# Patient Record
Sex: Female | Born: 1995 | Race: Black or African American | Hispanic: No | Marital: Single | Smoking: Never smoker
Health system: Southern US, Community
[De-identification: ages and names within clinical notes are randomized; demographics above are authoritative.]

## PROBLEM LIST (undated history)

## (undated) DIAGNOSIS — F319 Bipolar disorder, unspecified: Secondary | ICD-10-CM

## (undated) DIAGNOSIS — F419 Anxiety disorder, unspecified: Secondary | ICD-10-CM

---

## 2015-11-09 ENCOUNTER — Emergency Department: Payer: BLUE CROSS/BLUE SHIELD

## 2015-11-09 ENCOUNTER — Encounter: Payer: Self-pay | Admitting: Emergency Medicine

## 2015-11-09 ENCOUNTER — Emergency Department
Admission: EM | Admit: 2015-11-09 | Discharge: 2015-11-09 | Disposition: A | Discharge status: Home / Self Care | Payer: BLUE CROSS/BLUE SHIELD | Attending: Emergency Medicine | Admitting: Emergency Medicine

## 2015-11-09 DIAGNOSIS — N939 Abnormal uterine and vaginal bleeding, unspecified: Secondary | ICD-10-CM | POA: Diagnosis present

## 2015-11-09 DIAGNOSIS — N938 Other specified abnormal uterine and vaginal bleeding: Secondary | ICD-10-CM | POA: Insufficient documentation

## 2015-11-09 LAB — URINALYSIS COMPLETE WITH MICROSCOPIC (ARMC ONLY)
BILIRUBIN URINE: NEGATIVE
Bacteria, UA: NONE SEEN
GLUCOSE, UA: NEGATIVE mg/dL
Ketones, ur: NEGATIVE mg/dL
NITRITE: NEGATIVE
PH: 6 (ref 5.0–8.0)
Protein, ur: NEGATIVE mg/dL
SPECIFIC GRAVITY, URINE: 1.017 (ref 1.005–1.030)

## 2015-11-09 LAB — CHLAMYDIA/NGC RT PCR (ARMC ONLY)
CHLAMYDIA TR: DETECTED — AB
N GONORRHOEAE: NOT DETECTED

## 2015-11-09 LAB — WET PREP, GENITAL
CLUE CELLS WET PREP: NONE SEEN
Sperm: NONE SEEN
Trich, Wet Prep: NONE SEEN
Yeast Wet Prep HPF POC: NONE SEEN

## 2015-11-09 LAB — CBC
HCT: 41.8 % (ref 35.0–47.0)
Hemoglobin: 13.8 g/dL (ref 12.0–16.0)
MCH: 26.7 pg (ref 26.0–34.0)
MCHC: 33.1 g/dL (ref 32.0–36.0)
MCV: 80.6 fL (ref 80.0–100.0)
PLATELETS: 250 10*3/uL (ref 150–440)
RBC: 5.18 MIL/uL (ref 3.80–5.20)
RDW: 14 % (ref 11.5–14.5)
WBC: 8.6 10*3/uL (ref 3.6–11.0)

## 2015-11-09 LAB — BASIC METABOLIC PANEL
Anion gap: 9 (ref 5–15)
BUN: 17 mg/dL (ref 6–20)
CALCIUM: 9.7 mg/dL (ref 8.9–10.3)
CO2: 25 mmol/L (ref 22–32)
CREATININE: 0.87 mg/dL (ref 0.44–1.00)
Chloride: 106 mmol/L (ref 101–111)
GFR calc Af Amer: >60 mL/min (ref >60)
GLUCOSE: 92 mg/dL (ref 65–99)
Potassium: 3.9 mmol/L (ref 3.5–5.1)
SODIUM: 140 mmol/L (ref 135–145)

## 2015-11-09 LAB — PREGNANCY, URINE: Preg Test, Ur: NEGATIVE

## 2015-11-09 NOTE — ED Notes (Signed)
Patient ambulatory to triage with steady gait, without difficulty or distress noted; pt reports vag bleeding and lower abdominal cramping tonight

## 2015-11-09 NOTE — ED Notes (Addendum)
Pt states she has bleeding everyday since she came off her birth control in February (depo shot), fills up 1 pad every hour she states. Pt states sharp abdominal cramps, yellow discharge and a bump on vaginal area. States frequent UTI's.

## 2015-11-09 NOTE — ED Provider Notes (Signed)
[facility] Emergency Department Provider Note   ____________________________________________  Time seen: Approximately 502 AM  I have reviewed the triage vital signs and the nursing notes.   HISTORY  Chief Complaint Vaginal Bleeding    HPI Carolyn Jacobs is a 20 y.o. female comes into the hospital today with vaginal bleeding abdominal pain and bump on her vagina and some vaginal discharge. She reports that she has been bleeding every day since she got off her birth control in February. The patient was previously receiving the Depo-Provera shot. She reports that she has not seen anyone since then. She has intermittent abdominal pain she reports that has been coming and going for months but did have an episode of pain earlier this evening. She reports that she feels up a pad an hour and a heavy she is bleeding at this time. She does have an appointment to see OB/GYN on the 12th but reports that the pain continued to come in tonight. She reports that it was lower abdominal pain and sharp but it is gone at this time. The patient is concerned that she's been bleeding for so long so she wanted to come and get checked out.   History reviewed. No pertinent past medical history.  There are no active problems to display for this patient.   History reviewed. No pertinent past surgical history.  No current outpatient prescriptions on file.  Allergies Review of patient's allergies indicates no known allergies.  No family history on file.  Social History Social History  Substance Use Topics  . Smoking status: Never Smoker   . Smokeless tobacco: None  . Alcohol Use: No    Review of Systems Constitutional: No fever/chills Eyes: No visual changes. ENT: No sore throat. Cardiovascular: Denies chest pain. Respiratory: Denies shortness of breath. Gastrointestinal:  abdominal pain.  No nausea, no vomiting.  No diarrhea.  No constipation. Genitourinary: Vaginal  bleeding Musculoskeletal: Negative for back pain. Skin: Negative for rash. Neurological: Negative for headaches, focal weakness or numbness.  10-point ROS otherwise negative.  ____________________________________________   PHYSICAL EXAM:  VITAL SIGNS: ED Triage Vitals  Enc Vitals Group     BP 11/09/15 0249 121/82 mmHg     Pulse Rate 11/09/15 0249 82     Resp 11/09/15 0249 16     Temp 11/09/15 0249 98.3 F (36.8 C)     Temp Source 11/09/15 0249 Oral     SpO2 11/09/15 0249 98 %     Weight 11/09/15 0249 145 lb (65.772 kg)     Height 11/09/15 0249 5' (1.524 m)     Head Cir --      Peak Flow --      Pain Score 11/09/15 0248 8     Pain Loc --      Pain Edu? --      Excl. in GC? --     Constitutional: Alert and oriented. Well appearing and in Mild distress. Eyes: Conjunctivae are normal. PERRL. EOMI. Head: Atraumatic. Nose: No congestion/rhinnorhea. Mouth/Throat: Mucous membranes are moist.  Oropharynx non-erythematous. Cardiovascular: Normal rate, regular rhythm. Grossly normal heart sounds.  Good peripheral circulation. Respiratory: Normal respiratory effort.  No retractions. Lungs CTAB. Gastrointestinal: Soft and nontender. No distention. Positive bowel sounds Genitourinary: Normal external genitalia with scant blood in the vault, uterus is enlarged but nontender and non-boggy. No significant discharge noted. Cervical os is closed with no cervical motion tenderness. Musculoskeletal: No lower extremity tenderness nor edema.   Neurologic:  Normal speech and  language.  Skin:  Skin is warm, dry and intact.  Psychiatric: Mood and affect are normal.   ____________________________________________   LABS (all labs ordered are listed, but only abnormal results are displayed)  Labs Reviewed  WET PREP, GENITAL - Abnormal; Notable for the following:    WBC, Wet Prep HPF POC MODERATE (*)    All other components within normal limits  URINALYSIS COMPLETEWITH MICROSCOPIC ([facility]  ONLY) - Abnormal; Notable for the following:    Color, Urine YELLOW (*)    APPearance CLEAR (*)    Hgb urine dipstick 3+ (*)    Leukocytes, UA 2+ (*)    Squamous Epithelial / LPF 0-5 (*)    All other components within normal limits  CHLAMYDIA/NGC RT PCR ([facility] ONLY)  CBC  BASIC METABOLIC PANEL  PREGNANCY, URINE   ____________________________________________  EKG  None ____________________________________________  RADIOLOGY  Ultrasound: Small amount of fluid demonstrated in the cervical canal. Uterus and ovaries otherwise appear normal ____________________________________________   PROCEDURES  Procedure(s) performed: None  Critical Care performed: No  ____________________________________________   INITIAL IMPRESSION / ASSESSMENT AND PLAN / ED COURSE  Pertinent labs & imaging results that were available during my care of the patient were reviewed by me and considered in my medical decision making (see chart for details).  This is a 20 year old female who comes into the hospital today with months of bleeding abdominal pain she reports discharge as well as a bump on her vagina. The patient appears to have an ingrown hair follicle which is causing the bump is not infected there is no purulent material draining from it. The patient reports that she has been having bleeding for months but her hemoglobin and hematocrit December unremarkable. Given the size of her uterus I will send the patient for an ultrasound to evaluate for thickened endometrium or fibroids tumors. The patient will be reassessed once she is received her ultrasound.  The patient has minimal bleeding on her exam and her blood work and vaginal swabs are negative. I informed the patient that she should still follow up with the OB/GYN for further evaluation of this dysfunctional uterine bleeding. The patient has no further questions or concerns and she'll be discharged  home. ____________________________________________   FINAL CLINICAL IMPRESSION(S) / ED DIAGNOSES  Final diagnoses:  Vaginal bleeding  Dysfunctional uterine bleeding      NEW MEDICATIONS STARTED DURING THIS VISIT:  New Prescriptions   No medications on file     Note:  This document was prepared using Dragon voice recognition software and may include unintentional dictation errors.    Rebecka Apley, MD 11/09/15 [phone]

## 2015-11-09 NOTE — ED Notes (Signed)
POCT preg Neg

## 2015-11-09 NOTE — Discharge Instructions (Signed)
Abnormal Uterine Bleeding °Abnormal uterine bleeding can affect women at various stages in life, including teenagers, women in their reproductive years, pregnant women, and women who have reached menopause. Several kinds of uterine bleeding are considered abnormal, including: °· Bleeding or spotting between periods.   °· Bleeding after sexual intercourse.   °· Bleeding that is heavier or more than normal.   °· Periods that last longer than usual. °· Bleeding after menopause.   °Many cases of abnormal uterine bleeding are minor and simple to treat, while others are more serious. Any type of abnormal bleeding should be evaluated by your health care provider. Treatment will depend on the cause of the bleeding. °HOME CARE INSTRUCTIONS °Monitor your condition for any changes. The following actions may help to alleviate any discomfort you are experiencing: °· Avoid the use of tampons and douches as directed by your health care provider. °· Change your pads frequently. °You should get regular pelvic exams and Pap tests. Keep all follow-up appointments for diagnostic tests as directed by your health care provider.  °SEEK MEDICAL CARE IF:  °· Your bleeding lasts more than 1 week.   °· You feel dizzy at times.   °SEEK IMMEDIATE MEDICAL CARE IF:  °· You pass out.   °· You are changing pads every 15 to 30 minutes.   °· You have abdominal pain. °· You have a fever.   °· You become sweaty or weak.   °· You are passing large blood clots from the vagina.   °· You start to feel nauseous and vomit. °MAKE SURE YOU:  °· Understand these instructions. °· Will watch your condition. °· Will get help right away if you are not doing well or get worse. °  °This information is not intended to replace advice given to you by your health care provider. Make sure you discuss any questions you have with your health care provider. °  °Document Released: 06/26/2005 Document Revised: 07/01/2013 Document Reviewed: 01/23/2013 °Elsevier Interactive  Patient Education ©2016 [employer] ° °Dysfunctional Uterine Bleeding °Dysfunctional uterine bleeding is abnormal bleeding from the uterus. Dysfunctional uterine bleeding includes: °· A period that comes earlier or later than usual. °· A period that is lighter, heavier, or has blood clots. °· Bleeding between periods. °· Skipping one or more periods. °· Bleeding after sexual intercourse. °· Bleeding after menopause. °HOME CARE INSTRUCTIONS  °Pay attention to any changes in your symptoms. Follow these instructions to help with your condition: °Eating °· Eat well-balanced meals. Include foods that are high in iron, such as liver, meat, shellfish, green leafy vegetables, and eggs. °· If you become constipated: °¨ Drink plenty of water. °¨ Eat fruits and vegetables that are high in water and fiber, such as spinach, carrots, raspberries, apples, and mango. °Medicines °· Take over-the-counter and prescription medicines only as told by your health care provider. °· Do not change medicines without talking with your health care provider. °· Aspirin or medicines that contain aspirin may make the bleeding worse. Do not take those medicines: °¨ During the week before your period. °¨ During your period. °· If you were prescribed iron pills, take them as told by your health care provider. Iron pills help to replace iron that your body loses because of this condition. °Activity °· If you need to change your sanitary pad or tampon more than one time every 2 hours: °¨ Lie in bed with your feet raised (elevated). °¨ Place a cold pack on your lower abdomen. °¨ Rest as much as possible until the bleeding stops or slows down. °·   Do not try to lose weight until the bleeding has stopped and your blood iron level is back to normal. °Other Instructions °· For two months, write down: °¨ When your period starts. °¨ When your period ends. °¨ When any abnormal bleeding occurs. °¨ What problems you notice. °· Keep all follow up visits as told  by your health care provider. This is important. °SEEK MEDICAL CARE IF: °· You get light-headed or weak. °· You have nausea and vomiting. °· You cannot eat or drink without vomiting. °· You feel dizzy or have diarrhea while you are taking medicines. °· You are taking birth control pills or hormones, and you want to change them or stop taking them. °SEEK IMMEDIATE MEDICAL CARE IF: °· You develop a fever or chills. °· You need to change your sanitary pad or tampon more than one time per hour. °· Your bleeding becomes heavier, or your flow contains clots more often. °· You develop pain in your abdomen. °· You lose consciousness. °· You develop a rash. °  °This information is not intended to replace advice given to you by your health care provider. Make sure you discuss any questions you have with your health care provider. °  °Document Released: 06/23/2000 Document Revised: 03/17/2015 Document Reviewed: 09/21/2014 °Elsevier Interactive Patient Education ©2016 Elsevier Inc. ° °

## 2015-11-11 ENCOUNTER — Telehealth: Payer: Self-pay | Admitting: Emergency Medicine

## 2015-11-11 NOTE — ED Notes (Signed)
Called patient and informed her of positive chlamydia test and need for treatment.  I offered to call med in. She was asking about price and i explained [facility] and free tx for stds.  She is going to call them now and be treated there.

## 2016-08-03 ENCOUNTER — Ambulatory Visit (HOSPITAL_COMMUNITY)
Admission: EM | Admit: 2016-08-03 | Discharge: 2016-08-03 | Disposition: A | Discharge status: Home / Self Care | Payer: BLUE CROSS/BLUE SHIELD | Attending: Family Medicine | Admitting: Family Medicine

## 2016-08-03 ENCOUNTER — Encounter (HOSPITAL_COMMUNITY): Payer: Self-pay | Admitting: Emergency Medicine

## 2016-08-03 DIAGNOSIS — R102 Pelvic and perineal pain: Secondary | ICD-10-CM | POA: Diagnosis not present

## 2016-08-03 DIAGNOSIS — B9789 Other viral agents as the cause of diseases classified elsewhere: Secondary | ICD-10-CM | POA: Diagnosis not present

## 2016-08-03 DIAGNOSIS — J069 Acute upper respiratory infection, unspecified: Secondary | ICD-10-CM | POA: Insufficient documentation

## 2016-08-03 DIAGNOSIS — R109 Unspecified abdominal pain: Secondary | ICD-10-CM | POA: Diagnosis present

## 2016-08-03 LAB — POCT URINALYSIS DIP (DEVICE)
Bilirubin Urine: NEGATIVE
Glucose, UA: NEGATIVE mg/dL
HGB URINE DIPSTICK: NEGATIVE
Ketones, ur: NEGATIVE mg/dL
Leukocytes, UA: NEGATIVE
Nitrite: NEGATIVE
Protein, ur: NEGATIVE mg/dL
UROBILINOGEN UA: 2 mg/dL — AB (ref 0.0–1.0)
pH: 6 (ref 5.0–8.0)

## 2016-08-03 LAB — POCT PREGNANCY, URINE: Preg Test, Ur: NEGATIVE

## 2016-08-03 MED ORDER — AZITHROMYCIN 250 MG PO TABS
1000.0000 mg | ORAL_TABLET | Freq: Once | ORAL | Status: AC
  Administered 2016-08-03: 1000 mg via ORAL

## 2016-08-03 MED ORDER — AZITHROMYCIN 250 MG PO TABS
ORAL_TABLET | ORAL | Status: AC
  Filled 2016-08-03: qty 4

## 2016-08-03 NOTE — Discharge Instructions (Signed)
We are giving you an antibiotic that should help with your abdominal pain as well as your upper respiratory symptoms.

## 2016-08-03 NOTE — ED Triage Notes (Signed)
Pt c/o cold sx onset: 3 days  Sx include: HA, left ear pain, nasal congestion, prod cough  Denies: fevers  Taking: OTC cold meds w/no relief.   Also c/o consonant LLQ pain today... LBM = 2 days ago  LMP = 06/30/16

## 2016-08-03 NOTE — ED Provider Notes (Signed)
[facility]    CSN: [id number] Arrival date & time: 08/03/16  1703     History   Chief Complaint Chief Complaint  Patient presents with  . URI  . Abdominal Pain    HPI Carolyn Jacobs is a 21 y.o. female.   This is a 21 year old woman who presents to the [facility] with abdominal pain and upper respiratory symptoms.  She's had 3 days of nasal congestion, left ear pain, and cough.  Patient's also has left or quadrant and pelvic pain for 2 days. She's had no vaginal discharge. Her last month her period was on December 22 she does not think she's pregnant. She would like a pregnancy test just to be sure.      History reviewed. No pertinent past medical history.  There are no active problems to display for this patient.   History reviewed. No pertinent surgical history.  OB History    No data available       Home Medications    Prior to Admission medications   Not on File    Family History No family history on file.  Social History Social History  Substance Use Topics  . Smoking status: Never Smoker  . Smokeless tobacco: Never Used  . Alcohol use No     Allergies   Patient has no known allergies.   Review of Systems Review of Systems  Constitutional: Negative.   HENT: Positive for congestion, ear pain and sore throat.   Eyes: Negative.   Respiratory: Positive for cough.   Cardiovascular: Negative.   Gastrointestinal: Positive for abdominal pain.  Genitourinary: Positive for pelvic pain. Negative for difficulty urinating, dyspareunia, dysuria, enuresis, frequency, genital sores, vaginal bleeding, vaginal discharge and vaginal pain.  Musculoskeletal: Negative.   Neurological: Negative.      Physical Exam Triage Vital Signs ED Triage Vitals  Enc Vitals Group     BP      Pulse      Resp      Temp      Temp src      SpO2      Weight      Height      Head Circumference      Peak Flow    Pain Score      Pain Loc      Pain Edu?      Excl. in GC?    No data found.   Updated Vital Signs BP 120/75 (BP Location: Left Arm)   Pulse 69   Temp 98.3 F (36.8 C) (Oral)   LMP 06/30/2016   SpO2 98%    Physical Exam  Constitutional: She is oriented to person, place, and time. She appears well-developed and well-nourished.  HENT:  Head: Normocephalic.  Right Ear: External ear normal.  Left Ear: External ear normal.  Mouth/Throat: Oropharynx is clear and moist.  Eyes: Conjunctivae and EOM are normal. Pupils are equal, round, and reactive to light.  Neck: Normal range of motion. Neck supple.  Cardiovascular: Normal rate, regular rhythm and normal heart sounds.   Pulmonary/Chest: Effort normal and breath sounds normal.  Abdominal: Soft. Bowel sounds are normal. She exhibits no distension. There is tenderness.  Mild tenderness with deep palpation in the left lower quadrant  Musculoskeletal: Normal range of motion.  Neurological: She is alert and oriented to person, place, and time.  Skin: Skin is warm and dry.  Nursing note and vitals reviewed.    UC  Treatments / Results  Labs (all labs ordered are listed, but only abnormal results are displayed) Labs Reviewed  POCT URINALYSIS DIP (DEVICE) - Abnormal; Notable for the following:       Result Value   Urobilinogen, UA 2.0 (*)    All other components within normal limits  POCT PREGNANCY, URINE  URINE CYTOLOGY ANCILLARY ONLY    EKG  EKG Interpretation None       Radiology No results found.  Procedures Procedures (including critical care time)  Medications Ordered in UC Medications  azithromycin (ZITHROMAX) tablet 1,000 mg (not administered)     Initial Impression / Assessment and Plan / UC Course  I have reviewed the triage vital signs and the nursing notes.  Pertinent labs & imaging results that were available during my care of the patient were reviewed by me and considered in my medical decision  making (see chart for details).     Final Clinical Impressions(s) / UC Diagnoses   Final diagnoses:  Viral upper respiratory tract infection  Pelvic pain    New Prescriptions New Prescriptions   No medications on file     Elvina Sidle, MD 08/03/16 [phone]

## 2016-08-04 LAB — URINE CYTOLOGY ANCILLARY ONLY
Chlamydia: NEGATIVE
Neisseria Gonorrhea: NEGATIVE
Trichomonas: NEGATIVE

## 2018-12-21 ENCOUNTER — Encounter (HOSPITAL_BASED_OUTPATIENT_CLINIC_OR_DEPARTMENT_OTHER): Payer: Self-pay | Admitting: Emergency Medicine

## 2018-12-21 ENCOUNTER — Other Ambulatory Visit: Payer: Self-pay

## 2018-12-21 ENCOUNTER — Emergency Department (HOSPITAL_BASED_OUTPATIENT_CLINIC_OR_DEPARTMENT_OTHER)
Admission: EM | Admit: 2018-12-21 | Discharge: 2018-12-21 | Disposition: A | Discharge status: Home / Self Care | Payer: Medicaid Other | Attending: Emergency Medicine | Admitting: Emergency Medicine

## 2018-12-21 DIAGNOSIS — M654 Radial styloid tenosynovitis [de Quervain]: Secondary | ICD-10-CM | POA: Diagnosis not present

## 2018-12-21 DIAGNOSIS — N760 Acute vaginitis: Secondary | ICD-10-CM | POA: Insufficient documentation

## 2018-12-21 DIAGNOSIS — M25532 Pain in left wrist: Secondary | ICD-10-CM | POA: Diagnosis not present

## 2018-12-21 DIAGNOSIS — N390 Urinary tract infection, site not specified: Secondary | ICD-10-CM | POA: Insufficient documentation

## 2018-12-21 DIAGNOSIS — N898 Other specified noninflammatory disorders of vagina: Secondary | ICD-10-CM | POA: Diagnosis present

## 2018-12-21 LAB — URINALYSIS, MICROSCOPIC (REFLEX): WBC, UA: >50 WBC/hpf (ref 0–5)

## 2018-12-21 LAB — URINALYSIS, ROUTINE W REFLEX MICROSCOPIC
Bilirubin Urine: NEGATIVE
Glucose, UA: NEGATIVE mg/dL
Ketones, ur: NEGATIVE mg/dL
Nitrite: NEGATIVE
Protein, ur: NEGATIVE mg/dL
Specific Gravity, Urine: 1.02 (ref 1.005–1.030)
pH: 6 (ref 5.0–8.0)

## 2018-12-21 LAB — WET PREP, GENITAL
Clue Cells Wet Prep HPF POC: NONE SEEN
Sperm: NONE SEEN
Trich, Wet Prep: NONE SEEN

## 2018-12-21 LAB — PREGNANCY, URINE: Preg Test, Ur: NEGATIVE

## 2018-12-21 MED ORDER — CEPHALEXIN 500 MG PO CAPS: 500.0000 mg | ORAL_CAPSULE | Freq: Two times a day (BID) | ORAL | 0 refills | Status: DC

## 2018-12-21 MED ORDER — FLUCONAZOLE 150 MG PO TABS: 150.0000 mg | ORAL_TABLET | Freq: Once | ORAL | 0 refills | Status: AC

## 2018-12-21 NOTE — ED Triage Notes (Signed)
L wrist pain for several days. She had an xray at [facility] but left due to wait time so she did not get the result. Denies injury but she lifts heavy boxes at work.  Vaginal discharge and itching for several days as well.

## 2018-12-21 NOTE — ED Provider Notes (Signed)
[facility] EMERGENCY DEPARTMENT Provider Note   CSN: [id number] Arrival date & time: 12/21/18  1029    History   Chief Complaint Chief Complaint  Patient presents with  . Vaginal Discharge  . Wrist Pain    HPI Fidel Levyeaira Walls is a 23 y.o. female.     Patient is a 23 year old female who presents with vaginal discharge.  She states that she has had discharge for about 2 days.  She denies any abdominal pain.  No nausea or vomiting.  She is sexually active and is on the Depakote shot.  She has had a prior history of chlamydia.  No urinary symptoms.  She also complains of pain in her left wrist.  She says is been hurting for about a week and a half.  It is exacerbated at work by lifting heavy boxes.  She was at [facility] and had an x-ray 2 days ago but left before getting the results or having evaluation.     History reviewed. No pertinent past medical history.  There are no active problems to display for this patient.   History reviewed. No pertinent surgical history.   OB History   No obstetric history on file.      Home Medications    Prior to Admission medications   Medication Sig Start Date End Date Taking? Authorizing Provider  cephALEXin (KEFLEX) 500 MG capsule Take 1 capsule (500 mg total) by mouth 2 (two) times daily. 12/21/18   Rolan BuccoBelfi, Serafino Burciaga, MD  fluconazole (DIFLUCAN) 150 MG tablet Take 1 tablet (150 mg total) by mouth once for 1 dose. If symptoms not improved, may take one more pill in 7 days 12/21/18 12/21/18  Rolan BuccoBelfi, Madiline Saffran, MD    Family History No family history on file.  Social History Social History   Tobacco Use  . Smoking status: Never Smoker  . Smokeless tobacco: Never Used  Substance Use Topics  . Alcohol use: No  . Drug use: Yes    Types: Marijuana     Allergies   Patient has no known allergies.   Review of Systems Review of Systems  Constitutional: Negative for chills, diaphoresis, fatigue and fever.  HENT:  Negative for congestion, rhinorrhea and sneezing.   Eyes: Negative.   Respiratory: Negative for cough, chest tightness and shortness of breath.   Cardiovascular: Negative for chest pain and leg swelling.  Gastrointestinal: Negative for abdominal pain, blood in stool, diarrhea, nausea and vomiting.  Genitourinary: Positive for vaginal discharge. Negative for difficulty urinating, flank pain, frequency and hematuria.  Musculoskeletal: Positive for arthralgias. Negative for back pain, joint swelling and neck pain.  Skin: Negative for rash and wound.  Neurological: Negative for dizziness, speech difficulty, weakness, numbness and headaches.     Physical Exam Updated Vital Signs BP 122/79 (BP Location: Right Arm)   Pulse 69   Temp 98.4 F (36.9 C) (Oral)   Resp 18   Ht 5\' 1"  (1.549 m)   Wt 65.8 kg   SpO2 100%   BMI 27.40 kg/m   Physical Exam Constitutional:      Appearance: She is well-developed.  HENT:     Head: Normocephalic and atraumatic.  Eyes:     Pupils: Pupils are equal, round, and reactive to light.  Neck:     Musculoskeletal: Normal range of motion and neck supple.  Cardiovascular:     Rate and Rhythm: Normal rate and regular rhythm.     Heart sounds: Normal heart sounds.  Pulmonary:  Effort: Pulmonary effort is normal. No respiratory distress.     Breath sounds: Normal breath sounds. No wheezing or rales.  Chest:     Chest wall: No tenderness.  Abdominal:     General: Bowel sounds are normal.     Palpations: Abdomen is soft.     Tenderness: There is no abdominal tenderness. There is no guarding or rebound.  Genitourinary:    Comments: Positive thick brown discharge.  No cervical motion tenderness or adnexal tenderness Musculoskeletal: Normal range of motion.     Comments: Patient has tenderness over the extensor tendon of thumb over the lateral left wrist.  There is no bony tenderness noted.  She has normal sensation and motor function distally.  Radial  pulses are intact.  Lymphadenopathy:     Cervical: No cervical adenopathy.  Skin:    General: Skin is warm and dry.     Findings: No rash.  Neurological:     Mental Status: She is alert and oriented to person, place, and time.      ED Treatments / Results  Labs (all labs ordered are listed, but only abnormal results are displayed) Labs Reviewed  WET PREP, GENITAL - Abnormal; Notable for the following components:      Result Value   Yeast Wet Prep HPF POC PRESENT (*)    WBC, Wet Prep HPF POC MANY (*)    All other components within normal limits  URINALYSIS, ROUTINE W REFLEX MICROSCOPIC - Abnormal; Notable for the following components:   APPearance CLOUDY (*)    Hgb urine dipstick MODERATE (*)    Leukocytes,Ua LARGE (*)    All other components within normal limits  URINALYSIS, MICROSCOPIC (REFLEX) - Abnormal; Notable for the following components:   Bacteria, UA MANY (*)    All other components within normal limits  PREGNANCY, URINE  RPR  HIV ANTIBODY (ROUTINE TESTING W REFLEX)  GC/CHLAMYDIA PROBE AMP ([place]) NOT AT [facility]    EKG None  Radiology No results found.  Procedures Procedures (including critical care time)  Medications Ordered in ED Medications - No data to display   Initial Impression / Assessment and Plan / ED Course  I have reviewed the triage vital signs and the nursing notes.  Pertinent labs & imaging results that were available during my care of the patient were reviewed by me and considered in my medical decision making (see chart for details).        Patient is a 23 year old female who presents with vaginal discharge.  Her wet prep is positive for yeast.  She was started on Diflucan.  She also has evidence of a UTI and was started on Keflex.  She declined prophylactic treatment for STDs at this point.  She does have pain in her left wrist which is consistent with de Quervain's tendinitis.  She is neurologically intact.  There is no bony  tenderness.  She did have imaging studies 2 days ago which were negative for bony injury.  She was placed in thumb spica splint.  She was advised in symptomatic care.  She was given a referral to follow-up with Dr. Barbaraann Barthel if her symptoms or not improved.  Final Clinical Impressions(s) / ED Diagnoses   Final diagnoses:  De Quervain's tenosynovitis, left  Acute vaginitis  Urinary tract infection without hematuria, site unspecified    ED Discharge Orders         Ordered    fluconazole (DIFLUCAN) 150 MG tablet   Once  12/21/18 1238    cephALEXin (KEFLEX) 500 MG capsule  2 times daily     12/21/18 1238           Rolan BuccoBelfi, Shilpa Bushee, MD 12/21/18 1241

## 2018-12-22 LAB — HIV ANTIBODY (ROUTINE TESTING W REFLEX): HIV Screen 4th Generation wRfx: NONREACTIVE

## 2018-12-22 LAB — RPR: RPR Ser Ql: NONREACTIVE

## 2018-12-23 LAB — GC/CHLAMYDIA PROBE AMP (~~LOC~~) NOT AT ARMC
Chlamydia: NEGATIVE
Neisseria Gonorrhea: NEGATIVE

## 2019-07-26 ENCOUNTER — Emergency Department (HOSPITAL_BASED_OUTPATIENT_CLINIC_OR_DEPARTMENT_OTHER)
Admission: EM | Admit: 2019-07-26 | Discharge: 2019-07-26 | Disposition: A | Discharge status: Home / Self Care | Payer: Medicaid Other | Attending: Emergency Medicine | Admitting: Emergency Medicine

## 2019-07-26 ENCOUNTER — Other Ambulatory Visit: Payer: Self-pay

## 2019-07-26 DIAGNOSIS — F1210 Cannabis abuse, uncomplicated: Secondary | ICD-10-CM | POA: Diagnosis not present

## 2019-07-26 DIAGNOSIS — B373 Candidiasis of vulva and vagina: Secondary | ICD-10-CM | POA: Diagnosis not present

## 2019-07-26 DIAGNOSIS — B3731 Acute candidiasis of vulva and vagina: Secondary | ICD-10-CM

## 2019-07-26 DIAGNOSIS — N898 Other specified noninflammatory disorders of vagina: Secondary | ICD-10-CM | POA: Diagnosis present

## 2019-07-26 LAB — WET PREP, GENITAL
Clue Cells Wet Prep HPF POC: NONE SEEN
Sperm: NONE SEEN
Trich, Wet Prep: NONE SEEN

## 2019-07-26 MED ORDER — FLUCONAZOLE 150 MG PO TABS: ORAL_TABLET | ORAL | 0 refills | Status: DC

## 2019-07-26 MED ORDER — FLUCONAZOLE 150 MG PO TABS
150.0000 mg | ORAL_TABLET | Freq: Once | ORAL | Status: AC
  Administered 2019-07-26: 150 mg via ORAL
  Filled 2019-07-26: qty 1

## 2019-07-26 NOTE — ED Triage Notes (Addendum)
Pt c/o vagina discomfort, thinks she has a yeast infection. Pt just finished taking medication for BV

## 2019-07-26 NOTE — ED Provider Notes (Signed)
[facility] Provider Note: Carolyn Dell, MD, FACEP  CSN: [id number] MRN: [id number] ARRIVAL: 07/26/19 at 0158 ROOM: [facility]07/[facility]07   CHIEF COMPLAINT  Vaginal Discharge   HISTORY OF PRESENT ILLNESS  07/26/19 2:51 AM Carolyn Jacobs is a 24 y.o. female with a 2-day history of vulvovaginal irritation.  She also has a white discharge with this.  Symptoms are moderate's.  Symptoms are similar to previous yeast infections.  She was recently treated for bacterial vaginosis.  She denies abdominal pain or dysuria.   No past medical history on file.  No past surgical history on file.  No family history on file.  Social History   Tobacco Use  . Smoking status: Never Smoker  . Smokeless tobacco: Never Used  Substance Use Topics  . Alcohol use: No  . Drug use: Yes    Types: Marijuana    Prior to Admission medications   Medication Sig Start Date End Date Taking? Authorizing Provider  fluconazole (DIFLUCAN) 150 MG tablet Take 1 tablet if symptoms of yeast infection last beyond 3 days. 07/26/19   Kadesha Virrueta, Jonny Ruiz, MD    Allergies Patient has no known allergies.   REVIEW OF SYSTEMS  Negative except as noted here or in the History of Present Illness.   PHYSICAL EXAMINATION  Initial Vital Signs Blood pressure 92/78, pulse 93, resp. rate 18, height 5\' 1"  (1.549 m), weight 61.2 kg, SpO2 99 %.  Examination General: Well-developed, well-nourished female in no acute distress; appearance consistent with age of record HENT: normocephalic; atraumatic Eyes: pupils equal, round and reactive to light; extraocular muscles intact Neck: supple Heart: regular rate and rhythm Lungs: clear to auscultation bilaterally Abdomen: soft; nondistended; nontender; bowel sounds present GU: Vulvovaginal inflammation with yellowish-white curd-like discharge; patient unable to tolerate speculum exam Extremities: No deformity; full range of motion; pulses normal Neurologic: Awake, alert and oriented;  motor function intact in all extremities and symmetric; no facial droop Skin: Warm and dry Psychiatric: Normal mood and affect   RESULTS  Summary of this visit's results, reviewed and interpreted by myself:   EKG Interpretation  Date/Time:    Ventricular Rate:    PR Interval:    QRS Duration:   QT Interval:    QTC Calculation:   R Axis:     Text Interpretation:        Laboratory Studies: Results for orders placed or performed during the hospital encounter of 07/26/19 (from the past 24 hour(s))  Wet prep, genital     Status: Abnormal   Collection Time: 07/26/19  3:02 AM  Result Value Ref Range   Yeast Wet Prep HPF POC PRESENT (A) NONE SEEN   Trich, Wet Prep NONE SEEN NONE SEEN   Clue Cells Wet Prep HPF POC NONE SEEN NONE SEEN   WBC, Wet Prep HPF POC MANY (A) NONE SEEN   Sperm NONE SEEN    Imaging Studies: No results found.  ED COURSE and MDM  Nursing notes, initial and subsequent vitals signs, including pulse oximetry, reviewed and interpreted by myself.  Vitals:   07/26/19 0211 07/26/19 0213  BP:  92/78  Pulse:  93  Resp:  18  TempSrc:  Oral  SpO2:  99%  Weight: 61.2 kg   Height: 5\' 1"  (1.549 m)    Medications  fluconazole (DIFLUCAN) tablet 150 mg (150 mg Oral Given 07/26/19 0307)      PROCEDURES  Procedures   ED DIAGNOSES     ICD-10-CM   1. Vaginal yeast infection  B37.3        Cartrell Bentsen, Jenny Reichmann, MD 07/26/19 [phone]

## 2020-02-15 ENCOUNTER — Other Ambulatory Visit: Payer: Self-pay

## 2020-02-15 ENCOUNTER — Emergency Department (HOSPITAL_BASED_OUTPATIENT_CLINIC_OR_DEPARTMENT_OTHER)
Admission: EM | Admit: 2020-02-15 | Discharge: 2020-02-15 | Disposition: A | Discharge status: Home / Self Care | Payer: Medicaid Other | Attending: Emergency Medicine | Admitting: Emergency Medicine

## 2020-02-15 ENCOUNTER — Encounter (HOSPITAL_BASED_OUTPATIENT_CLINIC_OR_DEPARTMENT_OTHER): Payer: Self-pay | Admitting: Emergency Medicine

## 2020-02-15 DIAGNOSIS — M7918 Myalgia, other site: Secondary | ICD-10-CM | POA: Insufficient documentation

## 2020-02-15 DIAGNOSIS — M549 Dorsalgia, unspecified: Secondary | ICD-10-CM | POA: Insufficient documentation

## 2020-02-15 DIAGNOSIS — M542 Cervicalgia: Secondary | ICD-10-CM | POA: Insufficient documentation

## 2020-02-15 DIAGNOSIS — Z79899 Other long term (current) drug therapy: Secondary | ICD-10-CM | POA: Diagnosis not present

## 2020-02-15 MED ORDER — IBUPROFEN 400 MG PO TABS
600.0000 mg | ORAL_TABLET | Freq: Once | ORAL | Status: AC
  Administered 2020-02-15: 600 mg via ORAL
  Filled 2020-02-15: qty 1

## 2020-02-15 MED ORDER — CYCLOBENZAPRINE HCL 10 MG PO TABS: 10.0000 mg | ORAL_TABLET | Freq: Two times a day (BID) | ORAL | 0 refills | Status: DC | PRN

## 2020-02-15 MED ORDER — ACETAMINOPHEN 325 MG PO TABS
650.0000 mg | ORAL_TABLET | Freq: Once | ORAL | Status: AC
  Administered 2020-02-15: 650 mg via ORAL
  Filled 2020-02-15: qty 2

## 2020-02-15 NOTE — ED Notes (Signed)
Pt discharged to home. Discharge instructions have been discussed with patient and/or family members. Pt verbally acknowledges understanding d/c instructions, and endorses comprehension to checkout at registration before leaving. Pt requests to have pharmacy changed to [facility], [facility]
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
## 2020-02-15 NOTE — Discharge Instructions (Signed)
Take Flexeril as needed as prescribed for muscle tightness.  Do not drive if taking this medication. Take Motrin and Tylenol as needed as directed. Recommend warm compresses for 20 minutes at a time for sore muscles or soak in a warm tub. Check with your primary care provider, return to ER for severe or concerning symptoms.

## 2020-05-22 ENCOUNTER — Encounter (HOSPITAL_BASED_OUTPATIENT_CLINIC_OR_DEPARTMENT_OTHER): Payer: Self-pay

## 2020-05-22 ENCOUNTER — Emergency Department (HOSPITAL_BASED_OUTPATIENT_CLINIC_OR_DEPARTMENT_OTHER)
Admission: EM | Admit: 2020-05-22 | Discharge: 2020-05-22 | Disposition: A | Discharge status: Home / Self Care | Payer: Medicaid Other | Attending: Emergency Medicine | Admitting: Emergency Medicine

## 2020-05-22 ENCOUNTER — Other Ambulatory Visit: Payer: Self-pay

## 2020-05-22 DIAGNOSIS — N3000 Acute cystitis without hematuria: Secondary | ICD-10-CM | POA: Diagnosis not present

## 2020-05-22 DIAGNOSIS — J069 Acute upper respiratory infection, unspecified: Secondary | ICD-10-CM | POA: Insufficient documentation

## 2020-05-22 DIAGNOSIS — Z20822 Contact with and (suspected) exposure to covid-19: Secondary | ICD-10-CM | POA: Diagnosis not present

## 2020-05-22 DIAGNOSIS — R0602 Shortness of breath: Secondary | ICD-10-CM | POA: Diagnosis present

## 2020-05-22 LAB — URINALYSIS, ROUTINE W REFLEX MICROSCOPIC
Bilirubin Urine: NEGATIVE
Glucose, UA: NEGATIVE mg/dL
Ketones, ur: NEGATIVE mg/dL
Nitrite: NEGATIVE
Protein, ur: NEGATIVE mg/dL
Specific Gravity, Urine: 1.01 (ref 1.005–1.030)
pH: 6 (ref 5.0–8.0)

## 2020-05-22 LAB — RESPIRATORY PANEL BY RT PCR (FLU A&B, COVID)
Influenza A by PCR: NEGATIVE
Influenza B by PCR: NEGATIVE
SARS Coronavirus 2 by RT PCR: NEGATIVE

## 2020-05-22 LAB — URINALYSIS, MICROSCOPIC (REFLEX)

## 2020-05-22 LAB — PREGNANCY, URINE: Preg Test, Ur: NEGATIVE

## 2020-05-22 MED ORDER — SULFAMETHOXAZOLE-TRIMETHOPRIM 800-160 MG PO TABS: 1.0000 | ORAL_TABLET | Freq: Two times a day (BID) | ORAL | 0 refills | Status: AC

## 2020-05-22 MED ORDER — KETOROLAC TROMETHAMINE 30 MG/ML IJ SOLN
30.0000 mg | Freq: Once | INTRAMUSCULAR | Status: AC
  Administered 2020-05-22: 30 mg via INTRAMUSCULAR
  Filled 2020-05-22: qty 1

## 2020-05-22 NOTE — ED Triage Notes (Signed)
Pt states she would also like a pregnancy test today.

## 2020-05-22 NOTE — ED Triage Notes (Signed)
Pt arrives with c/o sore throat, headache, and SOB since Monday. Pt denies being around anyone with Covid, pt is not vaccinated.

## 2020-05-22 NOTE — ED Provider Notes (Signed)
[facility] EMERGENCY DEPARTMENT Provider Note   CSN: [id number] Arrival date & time: 05/22/20  1123     History Chief Complaint  Patient presents with  . Shortness of Breath    Carolyn Jacobs is a 24 y.o. female.  Pt presents to the ED today with a headache, sore throat, cough, and sob.  Sx started on 11/8.  The pt has not been vaccinated against Covid.  No known covid exposures.        History reviewed. No pertinent past medical history.  There are no problems to display for this patient.   History reviewed. No pertinent surgical history.   OB History   No obstetric history on file.     No family history on file.  Social History   Tobacco Use  . Smoking status: Never Smoker  . Smokeless tobacco: Never Used  Vaping Use  . Vaping Use: Never used  Substance Use Topics  . Alcohol use: No  . Drug use: Not Currently    Types: Marijuana    Home Medications Prior to Admission medications   Medication Sig Start Date End Date Taking? Authorizing Provider  cyclobenzaprine (FLEXERIL) 10 MG tablet Take 1 tablet (10 mg total) by mouth 2 (two) times daily as needed for muscle spasms. 02/15/20   Milagros Loll, MD  fluconazole (DIFLUCAN) 150 MG tablet Take 1 tablet if symptoms of yeast infection last beyond 3 days. 07/26/19   Molpus, John, MD  sulfamethoxazole-trimethoprim (BACTRIM DS) 800-160 MG tablet Take 1 tablet by mouth 2 (two) times daily for 7 days. 05/22/20 05/29/20  Jacalyn Lefevre, MD    Allergies    Patient has no known allergies.  Review of Systems   Review of Systems  HENT: Positive for sore throat.   Respiratory: Positive for cough and shortness of breath.   Neurological: Positive for headaches.  All other systems reviewed and are negative.   Physical Exam Updated Vital Signs BP (!) 119/91 (BP Location: Right Arm)   Pulse 76   Temp 98.7 F (37.1 C) (Oral)   Resp 18   Ht 5\' 1"  (1.549 m)   Wt 63.5 kg   SpO2 100%   BMI 26.45  kg/m   Physical Exam Vitals and nursing note reviewed.  Constitutional:      Appearance: She is well-developed.  HENT:     Head: Normocephalic and atraumatic.     Mouth/Throat:     Mouth: Mucous membranes are moist.  Eyes:     Pupils: Pupils are equal, round, and reactive to light.  Cardiovascular:     Rate and Rhythm: Normal rate and regular rhythm.  Pulmonary:     Effort: Pulmonary effort is normal.     Breath sounds: Normal breath sounds.  Abdominal:     General: Bowel sounds are normal.     Palpations: Abdomen is soft.  Musculoskeletal:        General: Normal range of motion.     Cervical back: Normal range of motion and neck supple.  Skin:    General: Skin is warm.     Capillary Refill: Capillary refill takes less than 2 seconds.  Neurological:     General: No focal deficit present.     Mental Status: She is alert and oriented to person, place, and time.  Psychiatric:        Mood and Affect: Mood normal.        Behavior: Behavior normal.     ED Results / Procedures /  Treatments   Labs (all labs ordered are listed, but only abnormal results are displayed) Labs Reviewed  URINALYSIS, ROUTINE W REFLEX MICROSCOPIC - Abnormal; Notable for the following components:      Result Value   APPearance HAZY (*)    Hgb urine dipstick TRACE (*)    Leukocytes,Ua SMALL (*)    All other components within normal limits  URINALYSIS, MICROSCOPIC (REFLEX) - Abnormal; Notable for the following components:   Bacteria, UA MANY (*)    All other components within normal limits  RESPIRATORY PANEL BY RT PCR (FLU A&B, COVID)  PREGNANCY, URINE    EKG None  Radiology No results found.  Procedures Procedures (including critical care time)  Medications Ordered in ED Medications  ketorolac (TORADOL) 30 MG/ML injection 30 mg (30 mg Intramuscular Given 05/22/20 1213)    ED Course  I have reviewed the triage vital signs and the nursing notes.  Pertinent labs & imaging results that  were available during my care of the patient were reviewed by me and considered in my medical decision making (see chart for details).    MDM Rules/Calculators/A&P                           Covid swab negative.  I spoke with her about getting the vaccine.  She will think about it.  Pt has some bacteria in her urine, so she will be treated with bactrim.  Return if worse.  Carolyn Jacobs was evaluated in Emergency Department on 05/22/2020 for the symptoms described in the history of present illness. She was evaluated in the context of the global COVID-19 pandemic, which necessitated consideration that the patient might be at risk for infection with the SARS-CoV-2 virus that causes COVID-19. Institutional protocols and algorithms that pertain to the evaluation of patients at risk for COVID-19 are in a state of rapid change based on information released by regulatory bodies including the CDC and federal and state organizations. These policies and algorithms were followed during the patient's care in the ED. Final Clinical Impression(s) / ED Diagnoses Final diagnoses:  Viral upper respiratory tract infection  Acute cystitis without hematuria    Rx / DC Orders ED Discharge Orders         Ordered    sulfamethoxazole-trimethoprim (BACTRIM DS) 800-160 MG tablet  2 times daily        05/22/20 1255           Jacalyn Lefevre, MD 05/22/20 1258

## 2020-05-26 ENCOUNTER — Telehealth (HOSPITAL_COMMUNITY): Payer: Self-pay

## 2020-07-20 ENCOUNTER — Encounter (HOSPITAL_BASED_OUTPATIENT_CLINIC_OR_DEPARTMENT_OTHER): Payer: Self-pay | Admitting: *Deleted

## 2020-07-20 ENCOUNTER — Other Ambulatory Visit: Payer: Self-pay

## 2020-07-20 ENCOUNTER — Emergency Department (HOSPITAL_BASED_OUTPATIENT_CLINIC_OR_DEPARTMENT_OTHER)
Admission: EM | Admit: 2020-07-20 | Discharge: 2020-07-20 | Disposition: A | Discharge status: Home / Self Care | Payer: Medicaid Other | Attending: Emergency Medicine | Admitting: Emergency Medicine

## 2020-07-20 DIAGNOSIS — Z5321 Procedure and treatment not carried out due to patient leaving prior to being seen by health care provider: Secondary | ICD-10-CM | POA: Insufficient documentation

## 2020-07-20 DIAGNOSIS — U071 COVID-19: Secondary | ICD-10-CM | POA: Insufficient documentation

## 2020-07-20 NOTE — ED Triage Notes (Signed)
Covid positive test at home today. She is here with c.o headache, fatigue, loss of taste, loss of smell, runny nose, headache and body aches.

## 2021-09-13 ENCOUNTER — Emergency Department (HOSPITAL_BASED_OUTPATIENT_CLINIC_OR_DEPARTMENT_OTHER)
Admission: EM | Admit: 2021-09-13 | Discharge: 2021-09-13 | Disposition: A | Discharge status: Home / Self Care | Payer: Medicaid Other | Attending: Emergency Medicine | Admitting: Emergency Medicine

## 2021-09-13 ENCOUNTER — Other Ambulatory Visit: Payer: Self-pay

## 2021-09-13 ENCOUNTER — Encounter (HOSPITAL_BASED_OUTPATIENT_CLINIC_OR_DEPARTMENT_OTHER): Payer: Self-pay | Admitting: Emergency Medicine

## 2021-09-13 DIAGNOSIS — R110 Nausea: Secondary | ICD-10-CM

## 2021-09-13 DIAGNOSIS — R112 Nausea with vomiting, unspecified: Secondary | ICD-10-CM | POA: Insufficient documentation

## 2021-09-13 DIAGNOSIS — Z20822 Contact with and (suspected) exposure to covid-19: Secondary | ICD-10-CM | POA: Diagnosis not present

## 2021-09-13 DIAGNOSIS — R102 Pelvic and perineal pain: Secondary | ICD-10-CM | POA: Diagnosis not present

## 2021-09-13 LAB — PREGNANCY, URINE: Preg Test, Ur: NEGATIVE

## 2021-09-13 LAB — URINALYSIS, MICROSCOPIC (REFLEX)

## 2021-09-13 LAB — WET PREP, GENITAL
Clue Cells Wet Prep HPF POC: NONE SEEN
Sperm: NONE SEEN
Trich, Wet Prep: NONE SEEN
WBC, Wet Prep HPF POC: <10 (ref <10)
Yeast Wet Prep HPF POC: NONE SEEN

## 2021-09-13 LAB — URINALYSIS, ROUTINE W REFLEX MICROSCOPIC
Bilirubin Urine: NEGATIVE
Glucose, UA: NEGATIVE mg/dL
Ketones, ur: NEGATIVE mg/dL
Leukocytes,Ua: NEGATIVE
Nitrite: NEGATIVE
Protein, ur: NEGATIVE mg/dL
Specific Gravity, Urine: 1.02 (ref 1.005–1.030)
pH: 8.5 — ABNORMAL HIGH (ref 5.0–8.0)

## 2021-09-13 LAB — RESP PANEL BY RT-PCR (FLU A&B, COVID) ARPGX2
Influenza A by PCR: NEGATIVE
Influenza B by PCR: NEGATIVE
SARS Coronavirus 2 by RT PCR: NEGATIVE

## 2021-09-13 MED ORDER — IBUPROFEN 600 MG PO TABS: 600.0000 mg | ORAL_TABLET | Freq: Four times a day (QID) | ORAL | 0 refills | Status: DC | PRN

## 2021-09-13 MED ORDER — ONDANSETRON HCL 4 MG PO TABS: 4.0000 mg | ORAL_TABLET | Freq: Four times a day (QID) | ORAL | 0 refills | Status: AC

## 2021-09-13 MED ORDER — ONDANSETRON 4 MG PO TBDP
4.0000 mg | ORAL_TABLET | Freq: Once | ORAL | Status: AC
  Administered 2021-09-13: 4 mg via ORAL
  Filled 2021-09-13: qty 1

## 2021-09-13 NOTE — ED Triage Notes (Signed)
Pt arrives pov with HA, abdominal pain with n/v/d upoin waking. Reports no symptoms yesterday. Endorses concern for pregnancy and/or STD ?

## 2021-09-13 NOTE — Discharge Instructions (Addendum)
You do not have a urinary tract infection today.  Your yeast, trichomonas and bacterial vaginosis tests are also negative. ? ?Please follow-up on your COVID/flu, chlamydia and gonorrhea results online.  If you test positive for something and require treatment, the health department is a good place to start.  You may also follow-up with the provider who diagnosed you with chlamydia previously. ? ?Zofran has been sent to your pharmacy for your nausea.  You may take Tylenol or ibuprofen if you continue to have any headaches. ?

## 2021-09-13 NOTE — ED Provider Notes (Incomplete)
°  [facility] EMERGENCY DEPARTMENT Provider Note   CSN: [id number] Arrival date & time: 09/13/21  1315     History {Add pertinent medical, surgical, social history, OB history to HPI:1} Chief Complaint  Patient presents with   Abdominal Pain    Carolyn Jacobs is a 26 y.o. female. Lmp Chlamydia recent   Abdominal Pain     Home Medications Prior to Admission medications   Medication Sig Start Date End Date Taking? Authorizing Provider  cyclobenzaprine (FLEXERIL) 10 MG tablet Take 1 tablet (10 mg total) by mouth 2 (two) times daily as needed for muscle spasms. 02/15/20   Milagros Loll, MD  fluconazole (DIFLUCAN) 150 MG tablet Take 1 tablet if symptoms of yeast infection last beyond 3 days. 07/26/19   Molpus, Jonny Ruiz, MD      Allergies    Patient has no known allergies.    Review of Systems   Review of Systems  Gastrointestinal:  Positive for abdominal pain.   Physical Exam Updated Vital Signs BP 114/78 (BP Location: Right Arm)    Pulse 79    Temp 98.8 F (37.1 C) (Oral)    Resp 18    Ht 5\' 1"  (1.549 m)    Wt 65.8 kg    LMP 09/06/2021    SpO2 100%    BMI 27.40 kg/m  Physical Exam  ED Results / Procedures / Treatments   Labs (all labs ordered are listed, but only abnormal results are displayed) Labs Reviewed  RESP PANEL BY RT-PCR (FLU A&B, COVID) ARPGX2  PREGNANCY, URINE  URINALYSIS, ROUTINE W REFLEX MICROSCOPIC    EKG None  Radiology No results found.  Procedures Procedures  {Document cardiac monitor, telemetry assessment procedure when appropriate:1}  Medications Ordered in ED Medications - No data to display  ED Course/ Medical Decision Making/ A&P                           Medical Decision Making Amount and/or Complexity of Data Reviewed Labs: ordered.   ***  {Document critical care time when appropriate:1} {Document review of labs and clinical decision tools ie heart score, Chads2Vasc2 etc:1}  {Document your independent review of  radiology images, and any outside records:1} {Document your discussion with family members, caretakers, and with consultants:1} {Document social determinants of health affecting pt's care:1} {Document your decision making why or why not admission, treatments were needed:1} Final Clinical Impression(s) / ED Diagnoses Final diagnoses:  None    Rx / DC Orders ED Discharge Orders     None

## 2021-09-13 NOTE — ED Provider Notes (Signed)
?[facility] EMERGENCY DEPARTMENT ?Provider Note ? ? ?CSN: [id number] ?Arrival date & time: 09/13/21  1315 ? ?  ? ?History ? ?Chief Complaint  ?Patient presents with  ? Abdominal Pain  ? ? ?Carolyn Jacobs is a 26 y.o. female presenting with nausea and vomiting.  She reports that this morning she woke up with the feeling that she needed to throw up.  For the next few hours she was dry heaving however never had any actual emesis.  Denies any new foods, says she ate pizza last night however she always eats from this place.  She is requesting a pregnancy test, urinalysis, STD check and viral testing.  Last menstrual period 5 days ago.  Just prior to starting her menses she was diagnosed with chlamydia and treated with azithromycin.  She would like to know that this is gone.  Denies any urinary symptoms.  Endorsing some pelvic pain. ? ? ?Home Medications ?Prior to Admission medications   ?Medication Sig Start Date End Date Taking? Authorizing Provider  ?ibuprofen (ADVIL) 600 MG tablet Take 1 tablet (600 mg total) by mouth every 6 (six) hours as needed. 09/13/21  Yes Ismeal Heider A, PA-C  ?ondansetron (ZOFRAN) 4 MG tablet Take 1 tablet (4 mg total) by mouth every 6 (six) hours. 09/13/21  Yes Nabria Nevin A, PA-C  ?cyclobenzaprine (FLEXERIL) 10 MG tablet Take 1 tablet (10 mg total) by mouth 2 (two) times daily as needed for muscle spasms. 02/15/20   Milagros Loll, MD  ?fluconazole (DIFLUCAN) 150 MG tablet Take 1 tablet if symptoms of yeast infection last beyond 3 days. 07/26/19   Molpus, Jonny Ruiz, MD  ?   ? ?Allergies    ?Patient has no known allergies.   ? ?Review of Systems   ?Review of Systems  ?Gastrointestinal:  Positive for abdominal pain.  ?See HPI ? ?Physical Exam ?Updated Vital Signs ?BP 114/78 (BP Location: Right Arm)   Pulse 79   Temp 98.8 ?F (37.1 ?C) (Oral)   Resp 18   Ht 5\' 1"  (1.549 m)   Wt 65.8 kg   LMP 09/06/2021   SpO2 100%   BMI 27.40 kg/m?  ?Physical Exam ?Vitals and nursing note  reviewed.  ?Constitutional:   ?   Appearance: Normal appearance. She is not ill-appearing.  ?HENT:  ?   Head: Normocephalic and atraumatic.  ?Eyes:  ?   General: No scleral icterus. ?   Conjunctiva/sclera: Conjunctivae normal.  ?Pulmonary:  ?   Effort: Pulmonary effort is normal. No respiratory distress.  ?Abdominal:  ?   General: Abdomen is flat.  ?   Palpations: Abdomen is soft.  ?   Tenderness: There is no abdominal tenderness. There is no guarding.  ?Genitourinary: ?   Comments: Patient had a recent pelvic exam.  Nontender to palpation externally.  Will self swab ?Skin: ?   General: Skin is warm and dry.  ?   Findings: No rash.  ?Neurological:  ?   Mental Status: She is alert.  ?Psychiatric:     ?   Mood and Affect: Mood normal.  ? ? ?ED Results / Procedures / Treatments   ?Labs ?(all labs ordered are listed, but only abnormal results are displayed) ?Labs Reviewed  ?URINALYSIS, ROUTINE W REFLEX MICROSCOPIC - Abnormal; Notable for the following components:  ?    Result Value  ? Color, Urine STRAW (*)   ? pH 8.5 (*)   ? Hgb urine dipstick SMALL (*)   ? All other components within  normal limits  ?URINALYSIS, MICROSCOPIC (REFLEX) - Abnormal; Notable for the following components:  ? Bacteria, UA RARE (*)   ? All other components within normal limits  ?WET PREP, GENITAL  ?RESP PANEL BY RT-PCR (FLU A&B, COVID) ARPGX2  ?PREGNANCY, URINE  ?GC/CHLAMYDIA PROBE AMP ([place]) NOT AT [facility]  ? ? ?EKG ?None ? ?Radiology ?No results found. ? ?Procedures ?Procedures  ? ?Medications Ordered in ED ?Medications  ?ondansetron (ZOFRAN-ODT) disintegrating tablet 4 mg (has no administration in time range)  ? ? ?ED Course/ Medical Decision Making/ A&P ?  ?                        ?Medical Decision Making ?Amount and/or Complexity of Data Reviewed ?Labs: ordered. ? ?Risk ?Prescription drug management. ? ? ?26 year old female presenting with nausea and vomiting.  Denies any abdominal pain but reports some pelvic discomfort.  Says that  this may be due to her menstrual period. ? ?Differential includes but is not limited to: Pregnancy, UTI, pyelonephritis, ovarian torsion, ectopic pregnancy, appendicitis, PID. ? ?PE: Patient with no guarding or reported pelvic tenderness.  Abdomen without bruising or trauma.  Pelvic exam was deferred due to patient's recent evaluation.  She would like STD testing, self swab initiated ? ?Work-up: Viral and STD swabs performed.  COVID and flu pending at this time.  Wet prep negative. ? ?Disposition: Patient is stable for discharge home with Zofran.  She will take Tylenol or ibuprofen for any headache.  She will follow-up on her COVID, flu and STD testing online.  Return precautions were discussed. ? ? ?Final Clinical Impression(s) / ED Diagnoses ?Final diagnoses:  ?Nausea  ? ? ?Rx / DC Orders ?ED Discharge Orders   ? ?      Ordered  ?  ibuprofen (ADVIL) 600 MG tablet  Every 6 hours PRN       ? 09/13/21 1353  ?  ondansetron (ZOFRAN) 4 MG tablet  Every 6 hours       ? 09/13/21 1354  ? ?  ?  ? ?  ? ?Results and diagnoses were explained to the patient. Return precautions discussed in full. Patient had no additional questions and expressed complete understanding. ? ? ?This chart was dictated using voice recognition software.  Despite best efforts to proofread,  errors can occur which can change the documentation meaning.  ?  ?Saddie Benders, PA-C ?09/13/21 1450 ? ?  ?Virgina Norfolk, DO ?09/13/21 1452 ? ?

## 2021-09-14 LAB — GC/CHLAMYDIA PROBE AMP (~~LOC~~) NOT AT ARMC
Chlamydia: POSITIVE — AB
Comment: NEGATIVE
Comment: NORMAL
Neisseria Gonorrhea: NEGATIVE

## 2021-10-11 ENCOUNTER — Other Ambulatory Visit: Payer: Self-pay

## 2021-10-11 ENCOUNTER — Emergency Department (HOSPITAL_BASED_OUTPATIENT_CLINIC_OR_DEPARTMENT_OTHER): Payer: Medicaid Other

## 2021-10-11 ENCOUNTER — Encounter (HOSPITAL_BASED_OUTPATIENT_CLINIC_OR_DEPARTMENT_OTHER): Payer: Self-pay | Admitting: Emergency Medicine

## 2021-10-11 ENCOUNTER — Emergency Department (HOSPITAL_BASED_OUTPATIENT_CLINIC_OR_DEPARTMENT_OTHER)
Admission: EM | Admit: 2021-10-11 | Discharge: 2021-10-11 | Disposition: A | Discharge status: Home / Self Care | Payer: Medicaid Other | Attending: Emergency Medicine | Admitting: Emergency Medicine

## 2021-10-11 DIAGNOSIS — S0990XA Unspecified injury of head, initial encounter: Secondary | ICD-10-CM

## 2021-10-11 DIAGNOSIS — H1132 Conjunctival hemorrhage, left eye: Secondary | ICD-10-CM | POA: Insufficient documentation

## 2021-10-11 DIAGNOSIS — S0083XA Contusion of other part of head, initial encounter: Secondary | ICD-10-CM

## 2021-10-11 DIAGNOSIS — R519 Headache, unspecified: Secondary | ICD-10-CM | POA: Diagnosis not present

## 2021-10-11 DIAGNOSIS — M542 Cervicalgia: Secondary | ICD-10-CM | POA: Diagnosis not present

## 2021-10-11 DIAGNOSIS — H02843 Edema of right eye, unspecified eyelid: Secondary | ICD-10-CM | POA: Insufficient documentation

## 2021-10-11 DIAGNOSIS — S161XXA Strain of muscle, fascia and tendon at neck level, initial encounter: Secondary | ICD-10-CM

## 2021-10-11 MED ORDER — TRAMADOL HCL 50 MG PO TABS: 50.0000 mg | ORAL_TABLET | Freq: Four times a day (QID) | ORAL | 0 refills | Status: DC | PRN

## 2021-10-11 MED ORDER — TRAMADOL HCL 50 MG PO TABS
50.0000 mg | ORAL_TABLET | Freq: Once | ORAL | Status: AC
  Administered 2021-10-11: 50 mg via ORAL
  Filled 2021-10-11: qty 1

## 2021-10-11 NOTE — Discharge Instructions (Signed)
Ice for 20 minutes every 2 hours while awake for the next 2 days. ? ?Take ibuprofen 600 mg every 6 hours as needed for pain.  Take tramadol as prescribed as needed for pain not relieved with ibuprofen. ? ?Follow-up with primary doctor if not improving in the next few days, and return to the ER if symptoms worsen or change. ?
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
## 2021-10-17 ENCOUNTER — Encounter (HOSPITAL_BASED_OUTPATIENT_CLINIC_OR_DEPARTMENT_OTHER): Payer: Self-pay | Admitting: Emergency Medicine

## 2021-10-17 ENCOUNTER — Emergency Department (HOSPITAL_BASED_OUTPATIENT_CLINIC_OR_DEPARTMENT_OTHER): Payer: Medicaid Other

## 2021-10-17 ENCOUNTER — Other Ambulatory Visit: Payer: Self-pay

## 2021-10-17 ENCOUNTER — Emergency Department (HOSPITAL_BASED_OUTPATIENT_CLINIC_OR_DEPARTMENT_OTHER)
Admission: EM | Admit: 2021-10-17 | Discharge: 2021-10-17 | Disposition: A | Discharge status: Home / Self Care | Payer: Medicaid Other | Attending: Emergency Medicine | Admitting: Emergency Medicine

## 2021-10-17 DIAGNOSIS — S161XXA Strain of muscle, fascia and tendon at neck level, initial encounter: Secondary | ICD-10-CM | POA: Insufficient documentation

## 2021-10-17 DIAGNOSIS — Y92410 Unspecified street and highway as the place of occurrence of the external cause: Secondary | ICD-10-CM | POA: Insufficient documentation

## 2021-10-17 DIAGNOSIS — S29019A Strain of muscle and tendon of unspecified wall of thorax, initial encounter: Secondary | ICD-10-CM | POA: Diagnosis not present

## 2021-10-17 DIAGNOSIS — S39012A Strain of muscle, fascia and tendon of lower back, initial encounter: Secondary | ICD-10-CM | POA: Insufficient documentation

## 2021-10-17 DIAGNOSIS — S199XXA Unspecified injury of neck, initial encounter: Secondary | ICD-10-CM | POA: Diagnosis present

## 2021-10-17 LAB — PREGNANCY, URINE: Preg Test, Ur: NEGATIVE

## 2021-10-17 MED ORDER — CYCLOBENZAPRINE HCL 10 MG PO TABS
10.0000 mg | ORAL_TABLET | Freq: Once | ORAL | Status: AC
Start: 2021-10-17 — End: 2021-10-17
  Administered 2021-10-17: 10 mg via ORAL
  Filled 2021-10-17: qty 1

## 2021-10-17 MED ORDER — TRAMADOL HCL 50 MG PO TABS: 100.0000 mg | ORAL_TABLET | Freq: Four times a day (QID) | ORAL | 0 refills | Status: AC | PRN

## 2021-10-17 MED ORDER — CYCLOBENZAPRINE HCL 10 MG PO TABS: 10.0000 mg | ORAL_TABLET | Freq: Three times a day (TID) | ORAL | 0 refills | Status: DC | PRN

## 2021-10-17 MED ORDER — NAPROXEN 250 MG PO TABS
500.0000 mg | ORAL_TABLET | Freq: Once | ORAL | Status: AC
  Administered 2021-10-17: 500 mg via ORAL
  Filled 2021-10-17: qty 2

## 2021-10-17 MED ORDER — NAPROXEN 375 MG PO TABS: ORAL_TABLET | ORAL | 0 refills | Status: DC
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
## 2022-02-23 ENCOUNTER — Emergency Department (HOSPITAL_BASED_OUTPATIENT_CLINIC_OR_DEPARTMENT_OTHER): Payer: Medicaid Other

## 2022-02-23 ENCOUNTER — Other Ambulatory Visit: Payer: Self-pay

## 2022-02-23 ENCOUNTER — Emergency Department (HOSPITAL_BASED_OUTPATIENT_CLINIC_OR_DEPARTMENT_OTHER)
Admission: EM | Admit: 2022-02-23 | Discharge: 2022-02-24 | Disposition: A | Discharge status: Home / Self Care | Payer: Medicaid Other | Attending: Emergency Medicine | Admitting: Emergency Medicine

## 2022-02-23 DIAGNOSIS — M791 Myalgia, unspecified site: Secondary | ICD-10-CM | POA: Diagnosis not present

## 2022-02-23 DIAGNOSIS — M542 Cervicalgia: Secondary | ICD-10-CM | POA: Insufficient documentation

## 2022-02-23 DIAGNOSIS — S0990XA Unspecified injury of head, initial encounter: Secondary | ICD-10-CM | POA: Diagnosis present

## 2022-02-23 DIAGNOSIS — S060X1A Concussion with loss of consciousness of 30 minutes or less, initial encounter: Secondary | ICD-10-CM | POA: Diagnosis not present

## 2022-02-23 DIAGNOSIS — M25562 Pain in left knee: Secondary | ICD-10-CM | POA: Diagnosis not present

## 2022-02-23 DIAGNOSIS — Y92410 Unspecified street and highway as the place of occurrence of the external cause: Secondary | ICD-10-CM | POA: Insufficient documentation

## 2022-02-23 DIAGNOSIS — R0781 Pleurodynia: Secondary | ICD-10-CM | POA: Diagnosis not present

## 2022-02-23 DIAGNOSIS — M25552 Pain in left hip: Secondary | ICD-10-CM | POA: Insufficient documentation

## 2022-02-23 DIAGNOSIS — M25512 Pain in left shoulder: Secondary | ICD-10-CM | POA: Insufficient documentation

## 2022-02-23 DIAGNOSIS — M7918 Myalgia, other site: Secondary | ICD-10-CM

## 2022-02-23 MED ORDER — METHOCARBAMOL 500 MG PO TABS
500.0000 mg | ORAL_TABLET | Freq: Once | ORAL | Status: AC
  Administered 2022-02-23: 500 mg via ORAL
  Filled 2022-02-23: qty 1

## 2022-02-23 MED ORDER — NAPROXEN 375 MG PO TABS: 375.0000 mg | ORAL_TABLET | Freq: Two times a day (BID) | ORAL | 0 refills | Status: DC

## 2022-02-23 MED ORDER — HYDROCODONE-ACETAMINOPHEN 5-325 MG PO TABS
2.0000 | ORAL_TABLET | Freq: Once | ORAL | Status: AC
  Administered 2022-02-23: 2 via ORAL
  Filled 2022-02-23: qty 2

## 2022-02-23 MED ORDER — METHOCARBAMOL 500 MG PO TABS: 500.0000 mg | ORAL_TABLET | Freq: Two times a day (BID) | ORAL | 0 refills | Status: DC
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
## 2022-02-28 ENCOUNTER — Encounter (HOSPITAL_BASED_OUTPATIENT_CLINIC_OR_DEPARTMENT_OTHER): Payer: Self-pay | Admitting: Emergency Medicine

## 2022-02-28 ENCOUNTER — Emergency Department (HOSPITAL_BASED_OUTPATIENT_CLINIC_OR_DEPARTMENT_OTHER): Payer: Medicaid Other

## 2022-02-28 ENCOUNTER — Other Ambulatory Visit: Payer: Self-pay

## 2022-02-28 ENCOUNTER — Emergency Department (HOSPITAL_BASED_OUTPATIENT_CLINIC_OR_DEPARTMENT_OTHER)
Admission: EM | Admit: 2022-02-28 | Discharge: 2022-03-01 | Disposition: A | Discharge status: Home / Self Care | Payer: Medicaid Other | Attending: Emergency Medicine | Admitting: Emergency Medicine

## 2022-02-28 DIAGNOSIS — M545 Low back pain, unspecified: Secondary | ICD-10-CM | POA: Diagnosis present

## 2022-02-28 DIAGNOSIS — M5442 Lumbago with sciatica, left side: Secondary | ICD-10-CM | POA: Diagnosis not present

## 2022-02-28 DIAGNOSIS — Y92410 Unspecified street and highway as the place of occurrence of the external cause: Secondary | ICD-10-CM | POA: Insufficient documentation

## 2022-02-28 DIAGNOSIS — M5432 Sciatica, left side: Secondary | ICD-10-CM

## 2022-02-28 MED ORDER — KETOROLAC TROMETHAMINE 15 MG/ML IJ SOLN
15.0000 mg | Freq: Once | INTRAMUSCULAR | Status: AC
Start: 2022-02-28 — End: 2022-02-28
  Administered 2022-02-28: 15 mg via INTRAMUSCULAR
  Filled 2022-02-28: qty 1

## 2022-02-28 MED ORDER — OXYCODONE HCL 5 MG PO TABS
5.0000 mg | ORAL_TABLET | Freq: Once | ORAL | Status: AC
  Administered 2022-02-28: 5 mg via ORAL
  Filled 2022-02-28: qty 1

## 2022-02-28 MED ORDER — ACETAMINOPHEN 500 MG PO TABS
1000.0000 mg | ORAL_TABLET | Freq: Once | ORAL | Status: AC
Start: 2022-02-28 — End: 2022-02-28
  Administered 2022-02-28: 1000 mg via ORAL
  Filled 2022-02-28: qty 2
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
## 2022-03-01 NOTE — ED Notes (Signed)
Written and verbal inst to pt  Verbalized an understanding  To home with family  

## 2022-03-01 NOTE — Discharge Instructions (Signed)
Your x-ray did not show a broken bone.  Please follow-up with your family doctor in the office.  I provided information for sports medicine doctor upstairs if you would like to see him.  Your back pain is most likely due to a muscular strain.  There is been a lot of research on back pain, unfortunately the only thing that seems to really help is Tylenol and ibuprofen.  Relative rest is also important to not lift greater than 10 pounds bending or twisting at the waist.  Please follow-up with your family physician.  The other thing that really seems to benefit patients is physical therapy which your doctor may send you for.  Please return to the emergency department for new numbness or weakness to your arms or legs. Difficulty with urinating or urinating or pooping on yourself.  Also if you cannot feel toilet paper when you wipe or get a fever.   Take 4 over the counter ibuprofen tablets 3 times a day or 2 over-the-counter naproxen tablets twice a day for pain. Also take tylenol 1000mg (2 extra strength) four times a day.

## 2022-03-17 ENCOUNTER — Emergency Department (HOSPITAL_BASED_OUTPATIENT_CLINIC_OR_DEPARTMENT_OTHER): Payer: Medicaid Other

## 2022-03-17 ENCOUNTER — Emergency Department (HOSPITAL_BASED_OUTPATIENT_CLINIC_OR_DEPARTMENT_OTHER)
Admission: EM | Admit: 2022-03-17 | Discharge: 2022-03-18 | Disposition: A | Discharge status: Home / Self Care | Payer: Medicaid Other | Attending: Emergency Medicine | Admitting: Emergency Medicine

## 2022-03-17 ENCOUNTER — Encounter (HOSPITAL_BASED_OUTPATIENT_CLINIC_OR_DEPARTMENT_OTHER): Payer: Self-pay

## 2022-03-17 ENCOUNTER — Other Ambulatory Visit: Payer: Self-pay

## 2022-03-17 DIAGNOSIS — S40011A Contusion of right shoulder, initial encounter: Secondary | ICD-10-CM

## 2022-03-17 DIAGNOSIS — Y92410 Unspecified street and highway as the place of occurrence of the external cause: Secondary | ICD-10-CM | POA: Insufficient documentation

## 2022-03-17 DIAGNOSIS — S4991XA Unspecified injury of right shoulder and upper arm, initial encounter: Secondary | ICD-10-CM | POA: Diagnosis present
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
## 2022-10-14 ENCOUNTER — Emergency Department (HOSPITAL_BASED_OUTPATIENT_CLINIC_OR_DEPARTMENT_OTHER): Payer: Medicaid Other

## 2022-10-14 ENCOUNTER — Encounter (HOSPITAL_BASED_OUTPATIENT_CLINIC_OR_DEPARTMENT_OTHER): Payer: Self-pay

## 2022-10-14 ENCOUNTER — Other Ambulatory Visit: Payer: Self-pay

## 2022-10-14 ENCOUNTER — Emergency Department (HOSPITAL_BASED_OUTPATIENT_CLINIC_OR_DEPARTMENT_OTHER)
Admission: EM | Admit: 2022-10-14 | Discharge: 2022-10-14 | Disposition: A | Discharge status: Home / Self Care | Payer: Medicaid Other | Attending: Emergency Medicine | Admitting: Emergency Medicine

## 2022-10-14 DIAGNOSIS — Y92410 Unspecified street and highway as the place of occurrence of the external cause: Secondary | ICD-10-CM | POA: Insufficient documentation

## 2022-10-14 DIAGNOSIS — M545 Low back pain, unspecified: Secondary | ICD-10-CM | POA: Diagnosis present

## 2022-10-14 LAB — PREGNANCY, URINE: Preg Test, Ur: NEGATIVE

## 2022-10-14 MED ORDER — CYCLOBENZAPRINE HCL 5 MG PO TABS: 5.0000 mg | ORAL_TABLET | Freq: Three times a day (TID) | ORAL | 0 refills | Status: AC | PRN

## 2022-10-14 MED ORDER — NAPROXEN 500 MG PO TABS: 500.0000 mg | ORAL_TABLET | Freq: Two times a day (BID) | ORAL | 0 refills | Status: DC | PRN

## 2022-10-14 MED ORDER — KETOROLAC TROMETHAMINE 15 MG/ML IJ SOLN
15.0000 mg | Freq: Once | INTRAMUSCULAR | Status: AC
  Administered 2022-10-14: 15 mg via INTRAMUSCULAR
  Filled 2022-10-14: qty 1

## 2022-10-14 MED ORDER — ACETAMINOPHEN 500 MG PO TABS
1000.0000 mg | ORAL_TABLET | Freq: Once | ORAL | Status: AC
  Administered 2022-10-14: 1000 mg via ORAL
  Filled 2022-10-14: qty 2

## 2022-10-14 MED ORDER — LIDOCAINE 5 % EX PTCH: 1.0000 | MEDICATED_PATCH | CUTANEOUS | 0 refills | Status: AC
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
## 2022-10-14 NOTE — Discharge Instructions (Addendum)
Please follow-up with your primary care doctor, make sure you are using ice needed for your pain.  If you have numbness tingling, weakness in your legs please return to the ER.  If you have any loss of bowel or bladder please return to the ER.  You should use the naproxen sent, and do not use any other anti-inflammatories to help with your pain.  You can augment your medication with Tylenol to help.  Do not drive when taking Flexeril as this can be sedating.
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
## 2023-01-05 ENCOUNTER — Other Ambulatory Visit: Payer: Self-pay

## 2023-01-05 ENCOUNTER — Emergency Department (HOSPITAL_BASED_OUTPATIENT_CLINIC_OR_DEPARTMENT_OTHER)
Admission: EM | Admit: 2023-01-05 | Discharge: 2023-01-05 | Discharge status: Against Medical Advice | Payer: Medicaid Other | Attending: Emergency Medicine | Admitting: Emergency Medicine

## 2023-01-05 ENCOUNTER — Encounter (HOSPITAL_BASED_OUTPATIENT_CLINIC_OR_DEPARTMENT_OTHER): Payer: Self-pay

## 2023-01-05 DIAGNOSIS — Z5321 Procedure and treatment not carried out due to patient leaving prior to being seen by health care provider: Secondary | ICD-10-CM | POA: Insufficient documentation

## 2023-01-05 DIAGNOSIS — M545 Low back pain, unspecified: Secondary | ICD-10-CM | POA: Diagnosis present

## 2023-01-05 DIAGNOSIS — Y92009 Unspecified place in unspecified non-institutional (private) residence as the place of occurrence of the external cause: Secondary | ICD-10-CM | POA: Insufficient documentation

## 2023-01-05 DIAGNOSIS — M25511 Pain in right shoulder: Secondary | ICD-10-CM | POA: Insufficient documentation

## 2023-01-05 DIAGNOSIS — Z20822 Contact with and (suspected) exposure to covid-19: Secondary | ICD-10-CM | POA: Insufficient documentation

## 2023-01-05 LAB — SARS CORONAVIRUS 2 BY RT PCR: SARS Coronavirus 2 by RT PCR: NEGATIVE
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
## 2023-05-10 ENCOUNTER — Emergency Department (HOSPITAL_BASED_OUTPATIENT_CLINIC_OR_DEPARTMENT_OTHER)
Admission: EM | Admit: 2023-05-10 | Discharge: 2023-05-10 | Disposition: A | Discharge status: Home / Self Care | Payer: MEDICAID | Attending: Emergency Medicine | Admitting: Emergency Medicine

## 2023-05-10 ENCOUNTER — Emergency Department (HOSPITAL_BASED_OUTPATIENT_CLINIC_OR_DEPARTMENT_OTHER): Payer: MEDICAID

## 2023-05-10 ENCOUNTER — Other Ambulatory Visit: Payer: Self-pay

## 2023-05-10 ENCOUNTER — Encounter (HOSPITAL_BASED_OUTPATIENT_CLINIC_OR_DEPARTMENT_OTHER): Payer: Self-pay | Admitting: Emergency Medicine

## 2023-05-10 DIAGNOSIS — W19XXXA Unspecified fall, initial encounter: Secondary | ICD-10-CM

## 2023-05-10 DIAGNOSIS — X501XXA Overexertion from prolonged static or awkward postures, initial encounter: Secondary | ICD-10-CM | POA: Insufficient documentation

## 2023-05-10 DIAGNOSIS — M25571 Pain in right ankle and joints of right foot: Secondary | ICD-10-CM | POA: Diagnosis present

## 2023-05-10 NOTE — Discharge Instructions (Addendum)
Tylenol Motrin as needed for pain, ice and elevate the ankle  Follow-up outpatient, return for any worsening symptoms
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
## 2023-09-12 ENCOUNTER — Other Ambulatory Visit: Payer: Self-pay

## 2023-09-12 ENCOUNTER — Emergency Department (HOSPITAL_BASED_OUTPATIENT_CLINIC_OR_DEPARTMENT_OTHER)
Admission: EM | Admit: 2023-09-12 | Discharge: 2023-09-12 | Disposition: A | Discharge status: Home / Self Care | Attending: Emergency Medicine | Admitting: Emergency Medicine

## 2023-09-12 ENCOUNTER — Encounter (HOSPITAL_BASED_OUTPATIENT_CLINIC_OR_DEPARTMENT_OTHER): Payer: Self-pay

## 2023-09-12 DIAGNOSIS — R059 Cough, unspecified: Secondary | ICD-10-CM | POA: Diagnosis not present

## 2023-09-12 DIAGNOSIS — F419 Anxiety disorder, unspecified: Secondary | ICD-10-CM | POA: Diagnosis present

## 2023-09-12 DIAGNOSIS — F410 Panic disorder [episodic paroxysmal anxiety] without agoraphobia: Secondary | ICD-10-CM

## 2023-09-12 DIAGNOSIS — R072 Precordial pain: Secondary | ICD-10-CM

## 2023-09-12 HISTORY — DX: Bipolar disorder, unspecified: F31.9

## 2023-09-12 HISTORY — DX: Anxiety disorder, unspecified: F41.9

## 2023-09-12 LAB — BASIC METABOLIC PANEL
Anion gap: 11 (ref 5–15)
BUN: 10 mg/dL (ref 6–20)
CO2: 23 mmol/L (ref 22–32)
Calcium: 9 mg/dL (ref 8.9–10.3)
Chloride: 102 mmol/L (ref 98–111)
Creatinine, Ser: 0.82 mg/dL (ref 0.44–1.00)
GFR, Estimated: >60 mL/min (ref >60)
Glucose, Bld: 144 mg/dL — ABNORMAL HIGH (ref 70–99)
Potassium: 3.2 mmol/L — ABNORMAL LOW (ref 3.5–5.1)
Sodium: 136 mmol/L (ref 135–145)

## 2023-09-12 LAB — CBC
HCT: 39.1 % (ref 36.0–46.0)
Hemoglobin: 12.6 g/dL (ref 12.0–15.0)
MCH: 27.7 pg (ref 26.0–34.0)
MCHC: 32.2 g/dL (ref 30.0–36.0)
MCV: 85.9 fL (ref 80.0–100.0)
Platelets: 233 10*3/uL (ref 150–400)
RBC: 4.55 MIL/uL (ref 3.87–5.11)
RDW: 15 % (ref 11.5–15.5)
WBC: 7.8 10*3/uL (ref 4.0–10.5)
nRBC: 0 % (ref 0.0–0.2)

## 2023-09-12 LAB — TROPONIN I (HIGH SENSITIVITY): Troponin I (High Sensitivity): <2 ng/L (ref <18)

## 2023-09-12 LAB — PREGNANCY, URINE: Preg Test, Ur: NEGATIVE

## 2023-09-12 MED ORDER — HYDROXYZINE HCL 25 MG PO TABS: 25.0000 mg | ORAL_TABLET | Freq: Three times a day (TID) | ORAL | 0 refills | Status: AC | PRN

## 2023-09-12 MED ORDER — POTASSIUM CHLORIDE CRYS ER 20 MEQ PO TBCR
20.0000 meq | EXTENDED_RELEASE_TABLET | Freq: Once | ORAL | Status: AC
  Administered 2023-09-12: 20 meq via ORAL
  Filled 2023-09-12: qty 1

## 2023-09-12 MED ORDER — HYDROXYZINE HCL 25 MG PO TABS
25.0000 mg | ORAL_TABLET | Freq: Once | ORAL | Status: AC
  Administered 2023-09-12: 25 mg via ORAL
  Filled 2023-09-12: qty 1

## 2023-09-12 NOTE — ED Triage Notes (Signed)
 Pt states that she has been having CP since Sun when she had an anxiety attack. Also c/o of a headache. Reports that she is supposed to take Risperdal and has not taken it in a year, feels very anxious today. Denies SI/HI, pt is requesting anxiety meds.

## 2023-09-12 NOTE — ED Provider Notes (Signed)
 [place] [facility] Provider Note   CSN: [id number] Arrival date & time: 09/12/23  0020     History  Chief Complaint  Patient presents with   Anxiety    Carolyn Jacobs is a 28 y.o. female.  The history is provided by the patient.  Anxiety  Carolyn Jacobs is a 28 y.o. female who presents to the Emergency Department complaining of anxiety.  She presents to the emergency department for anxiety and panic attack symptoms that started on Sunday.  She states that she started feeling shaking in her legs, chest pain and headache.  No associated shortness of breath.  She reports a central chest discomfort.  It is a tight pain.  Pain comes and goes.  No fever.  No leg swelling.  She does have mild cough.  She has had similar episodes in the past secondary to anxiety and panic attacks.  This feels like her prior attacks.  She does have a history of bipolar but is not currently on medications.  Just over a year ago she did take Risperdal for about 1 month but stopped it due to side effects.  No SI, HI, hallucinations.  She does use tobacco, occasional alcohol.  No oral contraceptives.  No history of DVT/PE.   Family hx/o HTN in mother.       Home Medications Prior to Admission medications   Medication Sig Start Date End Date Taking? Authorizing Provider  hydrOXYzine (ATARAX) 25 MG tablet Take 1 tablet (25 mg total) by mouth every 8 (eight) hours as needed. 09/12/23  Yes Tilden Fossa, MD  cyclobenzaprine (FLEXERIL) 5 MG tablet Take 1 tablet (5 mg total) by mouth 3 (three) times daily as needed for muscle spasms. 10/14/22   Small, Brooke L, PA  lidocaine (LIDODERM) 5 % Place 1 patch onto the skin daily. Remove & Discard patch within 12 hours or as directed by MD 10/14/22   Small, Brooke L, PA  naproxen (NAPROSYN) 500 MG tablet Take 1 tablet (500 mg total) by mouth 2 (two) times daily as needed. 10/14/22   Small, Brooke L, PA  ondansetron (ZOFRAN) 4 MG tablet Take 1  tablet (4 mg total) by mouth every 6 (six) hours. 09/13/21   Redwine, Madison A, PA-C  traMADol (ULTRAM) 50 MG tablet Take 2 tablets (100 mg total) by mouth every 6 (six) hours as needed (for pain). 10/17/21   Molpus, Jonny Ruiz, MD      Allergies    Patient has no known allergies.    Review of Systems   Review of Systems  All other systems reviewed and are negative.   Physical Exam Updated Vital Signs BP 101/62   Pulse 77   Temp 97.7 F (36.5 C)   Resp 18   Ht 5\' 1"  (1.549 m)   Wt 61.2 kg   SpO2 98%   BMI 25.51 kg/m  Physical Exam Vitals and nursing note reviewed.  Constitutional:      Appearance: She is well-developed.  HENT:     Head: Normocephalic and atraumatic.  Cardiovascular:     Rate and Rhythm: Normal rate and regular rhythm.     Heart sounds: No murmur heard. Pulmonary:     Effort: Pulmonary effort is normal. No respiratory distress.     Breath sounds: Normal breath sounds.  Abdominal:     Palpations: Abdomen is soft.     Tenderness: There is no abdominal tenderness. There is no guarding or rebound.  Musculoskeletal:  General: No swelling or tenderness.  Skin:    General: Skin is warm and dry.  Neurological:     Mental Status: She is alert and oriented to person, place, and time.  Psychiatric:        Behavior: Behavior normal.     ED Results / Procedures / Treatments   Labs (all labs ordered are listed, but only abnormal results are displayed) Labs Reviewed  BASIC METABOLIC PANEL - Abnormal; Notable for the following components:      Result Value   Potassium 3.2 (*)    Glucose, Bld 144 (*)    All other components within normal limits  CBC  PREGNANCY, URINE  TROPONIN I (HIGH SENSITIVITY)    EKG EKG Interpretation Date/Time:  Wednesday September 12 2023 00:31:00 EST Ventricular Rate:  70 PR Interval:  146 QRS Duration:  80 QT Interval:  360 QTC Calculation: 388 R Axis:   49  Text Interpretation: Normal sinus rhythm Cannot rule out Anterior  infarct , age undetermined Abnormal ECG No previous ECGs available Confirmed by Tilden Fossa [phone]) on 09/12/2023 2:52:58 AM  Radiology No results found.  Procedures Procedures    Medications Ordered in ED Medications  hydrOXYzine (ATARAX) tablet 25 mg (has no administration in time range)  potassium chloride SA (KLOR-CON M) CR tablet 20 mEq (20 mEq Oral Given 09/12/23 2956)    ED Course/ Medical Decision Making/ A&P                                 Medical Decision Making Amount and/or Complexity of Data Reviewed Labs: ordered.  Risk Prescription drug management.   Patient here for evaluation of chest pain, symptoms similar to prior panic attacks.  She is calm on evaluation and not actively suicidal, homicidal.  EKG is nonischemic, troponin is negative.  She does have mild hypokalemia-will replace orally.  Lungs are clear on examination without any respiratory distress.  Suspect she does have anxiety triggering her symptoms.  Current clinical picture is not consistent with PE, ACS, spontaneous pneumothorax, pneumonia.  Will provide as needed hydroxyzine for anxiety.  Provided resources for behavioral health follow-up with close return precautions.        Final Clinical Impression(s) / ED Diagnoses Final diagnoses:  Anxiety attack  Precordial pain    Rx / DC Orders ED Discharge Orders          Ordered    hydrOXYzine (ATARAX) 25 MG tablet  Every 8 hours PRN        09/12/23 0508              Tilden Fossa, MD 09/12/23 [phone]

## 2023-10-20 ENCOUNTER — Emergency Department (HOSPITAL_BASED_OUTPATIENT_CLINIC_OR_DEPARTMENT_OTHER)
Admission: EM | Admit: 2023-10-20 | Discharge: 2023-10-20 | Disposition: A | Discharge status: Home / Self Care | Attending: Emergency Medicine | Admitting: Emergency Medicine

## 2023-10-20 ENCOUNTER — Encounter (HOSPITAL_BASED_OUTPATIENT_CLINIC_OR_DEPARTMENT_OTHER): Payer: Self-pay

## 2023-10-20 ENCOUNTER — Other Ambulatory Visit: Payer: Self-pay

## 2023-10-20 DIAGNOSIS — N390 Urinary tract infection, site not specified: Secondary | ICD-10-CM | POA: Diagnosis not present

## 2023-10-20 DIAGNOSIS — R300 Dysuria: Secondary | ICD-10-CM | POA: Diagnosis present

## 2023-10-20 LAB — CBG MONITORING, ED: Glucose-Capillary: 101 mg/dL — ABNORMAL HIGH (ref 70–99)

## 2023-10-20 LAB — PREGNANCY, URINE: Preg Test, Ur: NEGATIVE

## 2023-10-20 LAB — URINALYSIS, MICROSCOPIC (REFLEX)

## 2023-10-20 LAB — WET PREP, GENITAL
Clue Cells Wet Prep HPF POC: NONE SEEN
Sperm: NONE SEEN
Trich, Wet Prep: NONE SEEN
WBC, Wet Prep HPF POC: <10 (ref <10)
Yeast Wet Prep HPF POC: NONE SEEN

## 2023-10-20 LAB — URINALYSIS, ROUTINE W REFLEX MICROSCOPIC
Bilirubin Urine: NEGATIVE
Glucose, UA: NEGATIVE mg/dL
Ketones, ur: NEGATIVE mg/dL
Nitrite: NEGATIVE
Protein, ur: 30 mg/dL — AB
Specific Gravity, Urine: 1.02 (ref 1.005–1.030)
pH: 7 (ref 5.0–8.0)

## 2023-10-20 MED ORDER — ACETAMINOPHEN 500 MG PO TABS
1000.0000 mg | ORAL_TABLET | Freq: Once | ORAL | Status: AC
  Administered 2023-10-20: 1000 mg via ORAL
  Filled 2023-10-20: qty 2

## 2023-10-20 MED ORDER — PHENAZOPYRIDINE HCL 200 MG PO TABS: 200.0000 mg | ORAL_TABLET | Freq: Three times a day (TID) | ORAL | 0 refills | Status: AC

## 2023-10-20 MED ORDER — CEPHALEXIN 250 MG PO CAPS
1000.0000 mg | ORAL_CAPSULE | Freq: Once | ORAL | Status: AC
  Administered 2023-10-20: 1000 mg via ORAL
  Filled 2023-10-20: qty 4

## 2023-10-20 MED ORDER — CEPHALEXIN 500 MG PO CAPS: 500.0000 mg | ORAL_CAPSULE | Freq: Four times a day (QID) | ORAL | 0 refills | Status: AC

## 2023-10-20 MED ORDER — NAPROXEN 250 MG PO TABS
500.0000 mg | ORAL_TABLET | Freq: Once | ORAL | Status: AC
  Administered 2023-10-20: 500 mg via ORAL
  Filled 2023-10-20: qty 2

## 2023-10-20 MED ORDER — PHENAZOPYRIDINE HCL 100 MG PO TABS
95.0000 mg | ORAL_TABLET | Freq: Once | ORAL | Status: AC
  Administered 2023-10-20: 100 mg via ORAL
  Filled 2023-10-20: qty 1

## 2023-10-20 MED ORDER — NAPROXEN 500 MG PO TABS: 500.0000 mg | ORAL_TABLET | Freq: Two times a day (BID) | ORAL | 0 refills | Status: AC

## 2023-10-20 NOTE — ED Triage Notes (Signed)
 Patient reports pain with urination, urinary frequency and some vaginal discharge. Noticed these symptoms today when she woke up.

## 2023-10-20 NOTE — ED Provider Notes (Signed)
  EMERGENCY DEPARTMENT AT [facility] Provider Note   CSN: [id number] Arrival date & time: 10/20/23  4098     History Chief Complaint  Patient presents with   Dysuria    Carolyn Jacobs is a 28 y.o. female.  28 yo F here with dysuria and urgency for the last 24 hours similar to previous UTI's. Also states she often gets yeast and BV and has unprotected intercourse often. Has had some change in odor but no real change in discharge. No pelvic pain. Patinet elected to self swab prior to my evaluation.     Dysuria      Home Medications Prior to Admission medications   Medication Sig Start Date End Date Taking? Authorizing Provider  cephALEXin (KEFLEX) 500 MG capsule Take 1 capsule (500 mg total) by mouth 4 (four) times daily. 10/20/23  Yes Iveliz Garay, MD  naproxen (NAPROSYN) 500 MG tablet Take 1 tablet (500 mg total) by mouth 2 (two) times daily. 10/20/23  Yes Judah Carchi, MD  phenazopyridine (PYRIDIUM) 200 MG tablet Take 1 tablet (200 mg total) by mouth 3 (three) times daily. 10/20/23  Yes Lilliahna Schubring, MD  cyclobenzaprine (FLEXERIL) 5 MG tablet Take 1 tablet (5 mg total) by mouth 3 (three) times daily as needed for muscle spasms. 10/14/22   Small, Brooke L, PA  hydrOXYzine (ATARAX) 25 MG tablet Take 1 tablet (25 mg total) by mouth every 8 (eight) hours as needed. 09/12/23   Kelsey Patricia, MD  lidocaine (LIDODERM) 5 % Place 1 patch onto the skin daily. Remove & Discard patch within 12 hours or as directed by MD 10/14/22   Small, Brooke L, PA  ondansetron (ZOFRAN) 4 MG tablet Take 1 tablet (4 mg total) by mouth every 6 (six) hours. 09/13/21   Redwine, Madison A, PA-C  traMADol (ULTRAM) 50 MG tablet Take 2 tablets (100 mg total) by mouth every 6 (six) hours as needed (for pain). 10/17/21   Molpus, Autry Legions, MD      Allergies    Patient has no known allergies.    Review of Systems   Review of Systems  Genitourinary:  Positive for dysuria.    Physical Exam Updated  Vital Signs BP 108/70 (BP Location: Right Arm)   Pulse 60   Temp 97.6 F (36.4 C) (Oral)   Resp 16   Ht 5\' 1"  (1.549 m)   Wt 65.8 kg   LMP 08/31/2023   SpO2 100%   BMI 27.40 kg/m  Physical Exam Vitals and nursing note reviewed.  Constitutional:      Appearance: She is well-developed.  HENT:     Head: Normocephalic and atraumatic.     Mouth/Throat:     Mouth: Mucous membranes are moist.  Cardiovascular:     Rate and Rhythm: Normal rate and regular rhythm.  Pulmonary:     Effort: No respiratory distress.     Breath sounds: No stridor.  Abdominal:     General: There is no distension.  Musculoskeletal:        General: No swelling or tenderness. Normal range of motion.     Cervical back: Normal range of motion.  Skin:    General: Skin is warm and dry.  Neurological:     General: No focal deficit present.     Mental Status: She is alert.     ED Results / Procedures / Treatments   Labs (all labs ordered are listed, but only abnormal results are displayed) Labs Reviewed  URINALYSIS, ROUTINE  W REFLEX MICROSCOPIC - Abnormal; Notable for the following components:      Result Value   Hgb urine dipstick LARGE (*)    Protein, ur 30 (*)    Leukocytes,Ua SMALL (*)    All other components within normal limits  URINALYSIS, MICROSCOPIC (REFLEX) - Abnormal; Notable for the following components:   Bacteria, UA FEW (*)    All other components within normal limits  CBG MONITORING, ED - Abnormal; Notable for the following components:   Glucose-Capillary 101 (*)    All other components within normal limits  WET PREP, GENITAL  PREGNANCY, URINE  GC/CHLAMYDIA PROBE AMP ([place]) NOT AT [facility]    EKG None  Radiology No results found.  Procedures Procedures    Medications Ordered in ED Medications  acetaminophen (TYLENOL) tablet 1,000 mg (1,000 mg Oral Given 10/20/23 0344)  cephALEXin (KEFLEX) capsule 1,000 mg (1,000 mg Oral Given 10/20/23 0455)  phenazopyridine (PYRIDIUM)  tablet 100 mg (100 mg Oral Given 10/20/23 0455)  naproxen (NAPROSYN) tablet 500 mg (500 mg Oral Given 10/20/23 0455)    ED Course/ Medical Decision Making/ A&P                                 Medical Decision Making Amount and/or Complexity of Data Reviewed Labs: ordered.  Risk OTC drugs. Prescription drug management.   UA c/w UTI, will treat for same. Wet prep reassuring. With multiple recurrent infections, discussed urinating after intercourse and will check cbg to r/o DM. otherwise will need urology follow up for any anatomic issues. Cbg reassuring. Will fu w/ pcp for further workup/management.    Final Clinical Impression(s) / ED Diagnoses Final diagnoses:  Lower urinary tract infectious disease    Rx / DC Orders ED Discharge Orders          Ordered    phenazopyridine (PYRIDIUM) 200 MG tablet  3 times daily        10/20/23 0518    cephALEXin (KEFLEX) 500 MG capsule  4 times daily        10/20/23 0518    naproxen (NAPROSYN) 500 MG tablet  2 times daily        10/20/23 0518              Onika Gudiel, Reymundo Caulk, MD 10/20/23 [phone]

## 2023-10-21 ENCOUNTER — Telehealth: Payer: Self-pay

## 2023-10-21 NOTE — Telephone Encounter (Signed)
 Patient left a message regarding test results for STD. No clue cells in wet prep,informed her I did not see specific testing but she is currently on antibiotics for UTI PCP to follow

## 2023-10-22 LAB — GC/CHLAMYDIA PROBE AMP (~~LOC~~) NOT AT ARMC
Chlamydia: NEGATIVE
Comment: NEGATIVE
Comment: NORMAL
Neisseria Gonorrhea: NEGATIVE
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
